# Patient Record
Sex: Male | Born: 1980 | Race: White | Hispanic: No | Marital: Single | Smoking: Never smoker
Health system: Southern US, Community
[De-identification: ages and names within clinical notes are randomized; demographics above are authoritative.]

## PROBLEM LIST (undated history)

## (undated) DIAGNOSIS — M549 Dorsalgia, unspecified: Secondary | ICD-10-CM

---
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
## 2000-03-21 ENCOUNTER — Emergency Department (HOSPITAL_COMMUNITY): Admission: EM | Admit: 2000-03-21 | Discharge: 2000-03-21 | Payer: Self-pay | Admitting: Emergency Medicine

## 2006-04-27 ENCOUNTER — Emergency Department (HOSPITAL_COMMUNITY): Admission: EM | Admit: 2006-04-27 | Discharge: 2006-04-27 | Payer: Self-pay | Admitting: Emergency Medicine

## 2007-06-28 ENCOUNTER — Emergency Department (HOSPITAL_COMMUNITY): Admission: EM | Admit: 2007-06-28 | Discharge: 2007-06-29 | Payer: Self-pay | Admitting: Emergency Medicine

## 2007-07-01 ENCOUNTER — Inpatient Hospital Stay (HOSPITAL_COMMUNITY): Admission: EM | Admit: 2007-07-01 | Discharge: 2007-07-04 | Payer: Self-pay | Admitting: Emergency Medicine

## 2007-07-02 ENCOUNTER — Ambulatory Visit: Payer: Self-pay | Admitting: Dentistry

## 2007-07-11 ENCOUNTER — Encounter: Admission: EM | Admit: 2007-07-11 | Discharge: 2007-07-11 | Payer: Self-pay | Admitting: Dentistry

## 2010-11-01 NOTE — Consult Note (Signed)
NAMESHIHEEM, CORPORAN                 ACCOUNT NO.:  [id number]   MEDICAL RECORD NO.:  [id number]          PATIENT TYPE:  INP   LOCATION:  1535                         FACILITY:  [facility]   PHYSICIAN:  Charlynne Pander, D.D.S.DATE OF BIRTH:  02-12-1981   DATE OF CONSULTATION:  07/02/2007  DATE OF DISCHARGE:                                 CONSULTATION   Terek L. Drew is a 30 year old male who is hospitalized with a history  of left facial swelling.  The patient was placed on IV antibiotic  therapy and dental consultation requested to rule out dental infection  and provide treatment as indicated.   MEDICAL HISTORY:  1. Left facial swelling/cellulitis with current IV antibiotic therapy      with Unasyn.  2. History of seasonal allergies - no current medications.   ALLERGIES/ADVERSE DRUG REACTIONS:  NONE KNOWN.   MEDICATIONS:  1. Unasyn 3 grams IV every 6 hours.  2. Tylenol 650 mg every 4 hours as needed.   SOCIAL HISTORY:  The patient is not married.  The patient is a  nonsmoker, nondrinker and denies any use of IV drugs.   FAMILY HISTORY:  Positive for hypertension.   FUNCTIONAL ASSESSMENT:  The patient was independent for ADLs prior to  this admission.   REVIEW OF SYSTEMS:  This is reviewed from the chart and health history  assessment form for this admission.   DENTAL HISTORY:  Chief complaint - dental consultation requested to  evaluate left facial swelling/cellulitis.   HISTORY OF PRESENT ILLNESS:  Patient with a history of left facial  swelling, which started approximately 3-4 days ago.  The patient went to  the ED and received a prescription for penicillin, as well as pain  medication.  The patient indicates that the swelling subsequently  increased and the patient went to the emergency room again on Monday and  was admitted and placed on IV antibiotic therapy.  Dental consultation  was then requested to evaluate to rule-out dental etiology provide total  treatment as  indicated.   The patient last saw a dentist approximately 1 year ago for evaluation  of this same tooth.  At that time the dentist would not pull the tooth  and recommended that it be restored with a dental restoration.  The  patient did not follow up with that dental treatment plan.   DENTAL EXAMINATION:  GENERAL:  The patient is well-developed, well-  nourished male in no acute distress.  VITAL SIGNS:  Blood pressure is 122/76, pulse rate 95, respirations are  18, temperature is 99.2.  HEAD/NECK:  There is significant left submandibular lymphadenopathy.  The patient without right submandibular lymphadenopathy. The patient  with trismus symptoms and there is a decreased maximum interincisal  opening approximately 15-20 mm at this time.  Access to the oral cavity  to extract this tooth would be very difficult at this time.  INTRAORAL EXAM:  Patient with a definite buccal swelling in the area of  #19.  This is fluctuant and tender to palpation/percussion.  DENTITION: the patient currently has an intact dentition with no missing  teeth.  PERIODONTAL: Patient with chronic periodontitis with plaque and calculus  accumulations, and incipient bone loss.  No significant tooth mobility  is noted at this time.  DENTAL CARIES: the patient has multiple dental caries and will need a  full series of dental radiographs to identify the extent of the dental  caries.  The caries associated with tooth #19 is extensive and is into  the pulp.  ENDODONTIC: The patient with a history of acute pulpitis symptoms  previously associated with tooth #19. Patient has not had significant  episodes of pain with this tooth at this time.  The primary complaining  is coming from the increased left facial swelling.  The patient does  have some symptoms of ear ache, which is most likely associated to this  dental etiology.  CROWN AND BRIDGE: There are no crown or bridge restorations.  OCCLUSION: Patient with a stable  occlusion at this time.   RADIOGRAPHIC INTERPRETATION:  A panoramic x-ray was taken on July 02, 2007.  There are no missing teeth.  There are multiple areas of dental  caries.  There is a periapical radiolucency associated with the apex of  tooth #19.  Some of the dental caries appear to be very close to  impinging on the pulp and may lead to other problems with tooth pain in  the near future. Suggest Full SEries of Dental Radiogrpahs to identify  the extent of dental diasease in the near future with the Dentist of his  choice.   ASSESSMENT:  1. Left facial swelling/cellulitis secondary to dental the etiology.  2. History of acute pulpitis symptoms.  3. 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  4. Multiple dental caries with a need for a full series of dental      radiographs to identify the extent of the dental caries.  5. History of oral neglect.  6. Stable occlusion.   PLAN/RECOMMENDATION:  1. I discussed the risks, benefits, and complications of various      treatment options with the patient in relationship to his medical      and dental conditions.  We discussed various treatment options to      include no treatment, extraction of tooth #19 with alveoloplasty as      indicated, incision and drainage as indicated, periodontal therapy,      dental restorations, referral to an oral surgeon for extraction of      remaining wisdom teeth, and need for overall comprehensive dental      treatment needs.  Due to the significant trismus at this time, it      was determined that we will allow time for the IV antibiotic      therapy to work and decrease the symptoms of trismus and increase      the maximum interincisal opening.  At that time I will determine      the ability to proceed with dental extraction tooth #19, which the      patient wishes to proceed with.  The patient indicates that he will      then followup with a dentist of his choice for other dental  care as      indicated.  This includes exam x-rays and overall dental treatment      need planning.  2. Discussion of findings with medical team as indicated.  Currently      the patient is on Unasyn  IV antibiotic      therapy. I personally feel  clindamycin 600-900 mg IV every 6 hours      may be a more appropriate antibiotic therapy, but I will defer to      the medical team at this time.  Will plan on reassessing the      patient on Thursday morning due to my absence from the clinic      tomorrow July 03, 2007.      Charlynne Pander, D.D.S.  Electronically Signed     RFK/MEDQ  D:  07/02/2007  T:  07/03/2007  Job:  [id number]   cc:   Altha Harm, MD   Charlynne Pander, D.D.S.  Fax: [phone]  Email: [email or web address]

## 2010-11-01 NOTE — H&P (Signed)
[name] Brady, [name] Brady                 ACCOUNT NO.:  [id number]   MEDICAL RECORD NO.:  [id number]          PATIENT TYPE:  INP   LOCATION:  1535                         FACILITY:  [facility]   PHYSICIAN:  Wilson Singer, M.D.DATE OF BIRTH:  [birth year]   DATE OF ADMISSION:  07/01/2007  DATE OF DISCHARGE:                              HISTORY & PHYSICAL   HISTORY OF PRESENT ILLNESS:  This is a 30 year old man who was in the  emergency room approximately 2 days ago with a toothache.  Apparently,  the toothache had improved to some extent but this morning he started to  notice swelling on the left side of his face.  He also has been feeling  feverish subjectively but no documented fever at home.  He has no nausea  or vomiting.  There are no rigors.   PAST SURGICAL HISTORY:  None.   PAST MEDICAL HISTORY:  No other serious illnesses.   MEDICATIONS:  No regular medications but recently has been on  hydrocodone/acetaminophen and also penicillin V as well as Ultram.   ALLERGIES:  None.   FAMILY HISTORY:  Hypertension.   SOCIAL HISTORY:  He is single and lives alone. He does not smoke and  does not drink alcohol.  He works intermittently as a Curator.   REVIEW OF SYSTEMS:  Apart from the symptoms mentioned above there are no  other symptoms referable to all systems reviewed.   PHYSICAL EXAMINATION:  VITAL SIGNS:  Temperature 100.7, blood pressure  143/90, pulse 80 on examination but previously recorded as 120 and 137  per minute, respiratory rate 12-14, saturation 98% on room air.  CARDIOVASCULAR:  Heart sounds are present and normal without any  murmurs.  RESPIRATORY:  Lung fields are clear.  ABDOMEN: Soft, nontender with no hepatosplenomegaly.  NEUROLOGICAL:  Alert and oriented with no focal neurological signs.  The left face is clearly swollen and inflamed with some localized  tenderness.  There is left neck lymphadenopathy.   INVESTIGATIONS:  Hemoglobin 16.8, white blood cell count  20.7, platelet  count 329,000.  Sodium 137, potassium 3.7, bicarbonate 25, glucose 115,  BUN 9, creatinine 0.83.  CT scan of the maxillofacial area shows dental  caries in the first molar area.   IMPRESSION:  1. Left facial cellulitis  2. Left first molar dental caries.   PLAN:  1. Admit.  2. Intravenous antibiotics.  3. Dental consultation in the morning.   Further recommendations will depend on patient's hospital progress.      Wilson Singer, M.D.  Electronically Signed     NCG/MEDQ  D:  07/01/2007  T:  07/02/2007  Job:  [id number]

## 2010-11-04 NOTE — Discharge Summary (Signed)
NAMEMARLEE, [name]                 ACCOUNT NO.:  [id number]   MEDICAL RECORD NO.:  [id number]          PATIENT TYPE:  INP   LOCATION:  1535                         FACILITY:  [facility]   PHYSICIAN:  Hind I Elsaid, MD      DATE OF BIRTH:  01/22/1981   DATE OF ADMISSION:  07/01/2007  DATE OF DISCHARGE:  07/04/2007                               DISCHARGE SUMMARY   DISCHARGE DIAGNOSES:  1. Left facial cellulitis.  2. 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  3. Multiple dental carries.   DISCHARGE MEDICATIONS:  1. Augment 875 mg p.o. b.i.d. for 2 weeks.  2. Percocet 5/325 1 to 2 tabs p.o. q.4 to 6 hours p.r.n.   CONSULTATIONS:  Dental consultation was done.   PROCEDURE:  CT of maxillofacial with contrast, diffuse soft tissue  swelling along the left mandible without evidence of focal soft tissue  abscess. Large caries involving the left mandibular first molar with  association  periapical radiolucency   HISTORY OF PRESENT ILLNESS:  Please review the history done by Dr.  Karilyn Cota. In summary, a 30 year old male admitted to the hospital with 2  days tooth ache, developed fever and swelling of his left face. The  patient was admitted and started on IV antibiotics. Dental consultation  was done. Will recommend to continue IV antibiotic with hopeful  resolution of the treatment and swelling to allow dental extraction. The  patient continued on antibiotics. Discussed with dentist.   PLAN:  To arrange dental extraction and caries cleaning as outpatient by  Dr. Oliver Hum on July 11, 2007, after completion of p.o. antibiotic.  We felt patient is medically stable to be discharged with home p.o.  antibiotic and analgesics.      Hind Bosie Helper, MD  Electronically Signed     HIE/MEDQ  D:  08/25/2007  T:  08/25/2007  Job:  [id number]

## 2011-03-09 LAB — BASIC METABOLIC PANEL
CO2: 25
Chloride: 102
GFR calc Af Amer: >60
Glucose, Bld: 115 — ABNORMAL HIGH
Potassium: 3.7
Sodium: 137

## 2011-03-09 LAB — DIFFERENTIAL
Basophils Absolute: 0
Basophils Relative: 0
Eosinophils Absolute: 0
Eosinophils Relative: 0
Eosinophils Relative: 1
Lymphocytes Relative: 19
Lymphocytes Relative: 9 — ABNORMAL LOW
Lymphs Abs: 1.9
Lymphs Abs: 2.4
Monocytes Absolute: 1.3 — ABNORMAL HIGH
Monocytes Absolute: 1.9 — ABNORMAL HIGH
Monocytes Relative: 9
Neutro Abs: 16.9 — ABNORMAL HIGH
Neutrophils Relative %: 82 — ABNORMAL HIGH

## 2011-03-09 LAB — CULTURE, BLOOD (ROUTINE X 2)
Culture: NO GROWTH
Culture: NO GROWTH

## 2011-03-09 LAB — COMPREHENSIVE METABOLIC PANEL
AST: 28
CO2: 26
Calcium: 9.1
Creatinine, Ser: 0.66
GFR calc Af Amer: >60
GFR calc non Af Amer: >60
Total Protein: 7.2

## 2011-03-09 LAB — CBC
HCT: 39.5
HCT: 50.3
HCT: 38.3 — ABNORMAL LOW
Hemoglobin: 13.8
Hemoglobin: 16.8
MCHC: 33.4
MCHC: 35.2
MCV: 88.9
MCV: 89.5
MCV: 89.5
MCV: 89.9
Platelets: 277
Platelets: 329
Platelets: 280
RBC: 4.69
RBC: 5.63
RDW: 11.9
RDW: 12.3
RDW: 12.5
RDW: 12
WBC: 12.6 — ABNORMAL HIGH
WBC: 20.7 — ABNORMAL HIGH

## 2011-03-09 LAB — BASIC METABOLIC PANEL WITH GFR
BUN: 9
Calcium: 9.8
Creatinine, Ser: 0.83
GFR calc non Af Amer: >60

## 2013-11-21 ENCOUNTER — Emergency Department (HOSPITAL_COMMUNITY): Payer: Medicaid Other

## 2013-11-21 ENCOUNTER — Encounter (HOSPITAL_COMMUNITY): Payer: Self-pay | Admitting: Emergency Medicine

## 2013-11-21 ENCOUNTER — Emergency Department (HOSPITAL_COMMUNITY)
Admission: EM | Admit: 2013-11-21 | Discharge: 2013-11-21 | Disposition: A | Discharge status: Home / Self Care | Payer: Medicaid Other | Attending: Emergency Medicine | Admitting: Emergency Medicine

## 2013-11-21 DIAGNOSIS — M545 Low back pain, unspecified: Secondary | ICD-10-CM | POA: Insufficient documentation

## 2013-11-21 DIAGNOSIS — G8929 Other chronic pain: Secondary | ICD-10-CM | POA: Insufficient documentation

## 2013-11-21 HISTORY — DX: Dorsalgia, unspecified: M54.9

## 2013-11-21 MED ORDER — ONDANSETRON 4 MG PO TBDP
4.0000 mg | ORAL_TABLET | Freq: Once | ORAL | Status: DC
  Filled 2013-11-21: qty 1

## 2013-11-21 MED ORDER — DEXAMETHASONE SODIUM PHOSPHATE 10 MG/ML IJ SOLN
10.0000 mg | Freq: Once | INTRAMUSCULAR | Status: AC
  Administered 2013-11-21: 10 mg via INTRAMUSCULAR
  Filled 2013-11-21 (×2): qty 1

## 2013-11-21 MED ORDER — CYCLOBENZAPRINE HCL 10 MG PO TABS: 10.0000 mg | ORAL_TABLET | Freq: Two times a day (BID) | ORAL | Status: AC | PRN

## 2013-11-21 MED ORDER — HYDROMORPHONE HCL PF 1 MG/ML IJ SOLN
1.0000 mg | Freq: Once | INTRAMUSCULAR | Status: DC
  Filled 2013-11-21: qty 1

## 2013-11-21 MED ORDER — TRAMADOL HCL 50 MG PO TABS: 50.0000 mg | ORAL_TABLET | Freq: Four times a day (QID) | ORAL | Status: AC | PRN

## 2013-11-21 NOTE — ED Notes (Signed)
Pt ambulates to xray.

## 2013-11-21 NOTE — Discharge Instructions (Signed)
Back Pain, Adult Low back pain is very common. About 1 in 5 people have back pain.The cause of low back pain is rarely dangerous. The pain often gets better over time.About half of people with a sudden onset of back pain feel better in just 2 weeks. About 8 in 10 people feel better by 6 weeks.  CAUSES Some common causes of back pain include:  Strain of the muscles or ligaments supporting the spine.  Wear and tear (degeneration) of the spinal discs.  Arthritis.  Direct injury to the back. DIAGNOSIS Most of the time, the direct cause of low back pain is not known.However, back pain can be treated effectively even when the exact cause of the pain is unknown.Answering your caregiver's questions about your overall health and symptoms is one of the most accurate ways to make sure the cause of your pain is not dangerous. If your caregiver needs more information, he or she may order lab work or imaging tests (X-rays or MRIs).However, even if imaging tests show changes in your back, this usually does not require surgery. HOME CARE INSTRUCTIONS For many people, back pain returns.Since low back pain is rarely dangerous, it is often a condition that people can learn to manageon their own.   Remain active. It is stressful on the back to sit or stand in one place. Do not sit, drive, or stand in one place for more than 30 minutes at a time. Take short walks on level surfaces as soon as pain allows.Try to increase the length of time you walk each day.  Do not stay in bed.Resting more than 1 or 2 days can delay your recovery.  Do not avoid exercise or work.Your body is made to move.It is not dangerous to be active, even though your back may hurt.Your back will likely heal faster if you return to being active before your pain is gone.  Pay attention to your body when you bend and lift. Many people have less discomfortwhen lifting if they bend their knees, keep the load close to their bodies,and  avoid twisting. Often, the most comfortable positions are those that put less stress on your recovering back.  Find a comfortable position to sleep. Use a firm mattress and lie on your side with your knees slightly bent. If you lie on your back, put a pillow under your knees.  Only take over-the-counter or prescription medicines as directed by your caregiver. Over-the-counter medicines to reduce pain and inflammation are often the most helpful.Your caregiver may prescribe muscle relaxant drugs.These medicines help dull your pain so you can more quickly return to your normal activities and healthy exercise.  Put ice on the injured area.  Put ice in a plastic bag.  Place a towel between your skin and the bag.  Leave the ice on for 15-20 minutes, 03-04 times a day for the first 2 to 3 days. After that, ice and heat may be alternated to reduce pain and spasms.  Ask your caregiver about trying back exercises and gentle massage. This may be of some benefit.  Avoid feeling anxious or stressed.Stress increases muscle tension and can worsen back pain.It is important to recognize when you are anxious or stressed and learn ways to manage it.Exercise is a great option. SEEK MEDICAL CARE IF:  You have pain that is not relieved with rest or medicine.  You have pain that does not improve in 1 week.  You have new symptoms.  You are generally not feeling well. SEEK   IMMEDIATE MEDICAL CARE IF:   You have pain that radiates from your back into your legs.  You develop new bowel or bladder control problems.  You have unusual weakness or numbness in your arms or legs.  You develop nausea or vomiting.  You develop abdominal pain.  You feel faint. Document Released: 06/05/2005 Document Revised: 12/05/2011 Document Reviewed: 10/24/2010 [employer] Patient Information 2014 [employer].  

## 2013-11-21 NOTE — ED Notes (Addendum)
Pt c/o lower back pain x 2 weeks.  Pain score 5/10.  Pt sts Hx of chronic back pain and injuries.  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

## 2013-11-21 NOTE — ED Notes (Signed)
Pt declines pharmacologic interventions at this time, provided gown with instructions

## 2013-11-21 NOTE — ED Notes (Signed)
Pt alert, nad, resp even unlabored, skin pwd, denies needs, ambulates to discharge 

## 2013-11-21 NOTE — ED Provider Notes (Signed)
CSN: [id number]     Arrival date & time 11/21/13  1409 History  This chart was scribed for non-physician practitioner, Marlon Peliffany Cotton Beckley, PA-C,working with Audree CamelScott T Goldston, MD, by Karle PlumberJennifer Tensley, ED Scribe.  This patient was seen in room WTR8/WTR8 and the patient's care was started at 2:48 PM.  Chief Complaint  Patient presents with  . Back Pain   The history is provided by the patient. No language interpreter was used.   HPI Comments:  [name] Brady is a 33 y.o. male with chronic back pain who presents to the Emergency Department complaining of worsening moderate lower back pain that radiates to his groin that started approximately 2 weeks ago. He states he was pushing a toilet PTA and experienced a sharp shooting pain that caused him to fall to the ground. He reports lifting heavy things on a daily basis due to his job. He states he feels tingling in his toes while sitting. He denies taking anything for pain. He reports the pain is somewhat relieved by standing. He denies numbness. He declines any pain medication here or at home. Says he does not like shots of pills. He is here since wife and parents want him to get an xray of his back. He is ambulatory but with pain  Past Medical History  Diagnosis Date  . Back pain    History reviewed. No pertinent past surgical history. History reviewed. No pertinent family history. History  Substance Use Topics  . Smoking status: Never Smoker   . Smokeless tobacco: Not on file  . Alcohol Use: No    Review of Systems  Musculoskeletal: Positive for back pain.  Neurological: Negative for numbness.  All other systems reviewed and are negative.   Allergies  Review of patient's allergies indicates not on file.  Home Medications   Prior to Admission medications   Medication Sig Start Date End Date Taking? Authorizing Provider  cyclobenzaprine (FLEXERIL) 10 MG tablet Take 1 tablet (10 mg total) by mouth 2 (two) times daily as needed for muscle  spasms. 11/21/13   Brookes Craine Irine SealG Noemie Devivo, PA-C  traMADol (ULTRAM) 50 MG tablet Take 1 tablet (50 mg total) by mouth every 6 (six) hours as needed. 11/21/13   Dorthula Matasiffany G Dyana Magner, PA-C   Triage Vitals: BP 136/91  Pulse 73  Temp(Src) 98.3 F (36.8 C) (Oral)  Resp 16  SpO2 98% Physical Exam  Nursing note and vitals reviewed. Constitutional: He is oriented to person, place, and time. He appears well-developed and well-nourished.  HENT:  Head: Normocephalic and atraumatic.  Eyes: EOM are normal.  Neck: Normal range of motion.  Cardiovascular: Normal rate.   Pulmonary/Chest: Effort normal.  Musculoskeletal:  Limited ROM of the spine due to pain. Full ROM of knees and hips. Positive SLR on right side.  Neurological: He is alert and oriented to person, place, and time.  Skin: Skin is warm and dry.  Psychiatric: He has a normal mood and affect. His behavior is normal.    ED Course  Procedures (including critical care time) DIAGNOSTIC STUDIES: Oxygen Saturation is 98% on RA, normal by my interpretation.   COORDINATION OF CARE: 2:53 PM- Will X-Ray L-Spine and order pain medication. Pt verbalizes understanding and agrees to plan.  Medications  dexamethasone (DECADRON) injection 10 mg (10 mg Intramuscular Given 11/21/13 1615)    Labs Review Labs Reviewed - No data to display  Imaging Review Dg Lumbar Spine Complete  11/21/2013   CLINICAL DATA:  Back pain  EXAM: LUMBAR  SPINE - COMPLETE 4+ VIEW  COMPARISON:  None.  FINDINGS: There is minimal convex leftward lumbar scoliosis. No fracture. No subluxation. Intervertebral disc spaces are preserved throughout. No evidence for pars interarticularis defect. The SI joints are normal bilaterally.  IMPRESSION: No acute bony findings to explain the patient's history of pain.   Electronically Signed   By: Kennith Center M.D.   On: 11/21/2013 15:44     EKG Interpretation None      MDM   Final diagnoses:  Low back pain    Patient declined shot of IM  analgesic but did eventually agree to IM Decadron. Declines pain or muscle relaxer Rx, but I gave him one anyway and he is free to not get them filled if he chooses not to. I told him he could benefit from an orthopedist, possibly getting an MRI or physical therapy- he seemed receptive to this.  32 y.o.[name] Brady's  with back pain. No neurological deficits and normal neuro exam. Patient can walk but states is painful. No loss of bowel or bladder control. No concern for cauda equina. No fever, night sweats, weight loss, h/o cancer, IVDU. RICE protocol and pain medicine indicated and discussed with patient.   Patient Plan 1. Medications: pain medication, muscle relaxer and usual home medications  2. Treatment: rest, drink plenty of fluids, gentle stretching as discussed, alternate ice and heat  3. Follow Up: Please followup with your primary doctor for discussion of your diagnoses and further evaluation after today's visit; if you do not have a primary care doctor use the resource guide provided to find one   Vital signs are stable at discharge. Filed Vitals:   11/21/13 1419  BP: 136/91  Pulse: 73  Temp: 98.3 F (36.8 C)  Resp: 16    Patient/guardian has voiced understanding and agreed to follow-up with the PCP or specialist.    I personally performed the services described in this documentation, which was scribed in my presence. The recorded information has been reviewed and is accurate.    Dorthula Matas, PA-C 11/21/13 1926

## 2013-11-25 NOTE — ED Provider Notes (Signed)
Medical screening examination/treatment/procedure(s) were performed by non-physician practitioner and as supervising physician I was immediately available for consultation/collaboration.   EKG Interpretation None        Audree Camel, MD 11/25/13 0040

## 2014-10-03 IMAGING — CR DG LUMBAR SPINE COMPLETE 4+V
5 series · 5 of 5 positions shown · non-contrast
Comparison: None.

CLINICAL DATA: Back pain

EXAM:
LUMBAR SPINE - COMPLETE 4+ VIEW

[t lumbar spine ap]
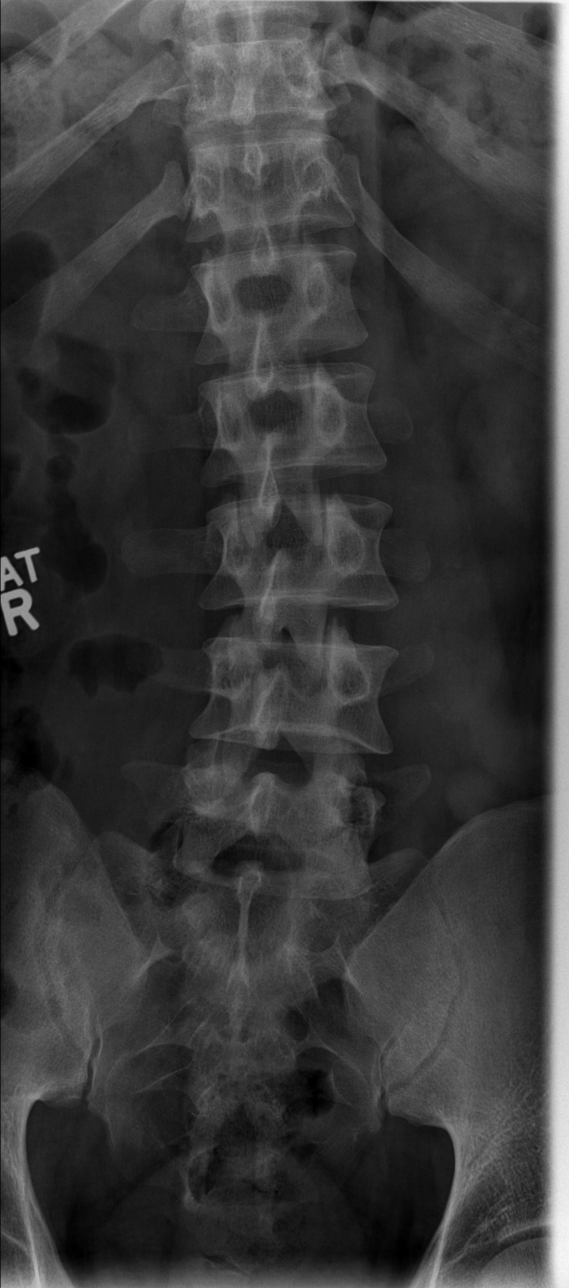

[t lumbar spine obl (1 of 2)]
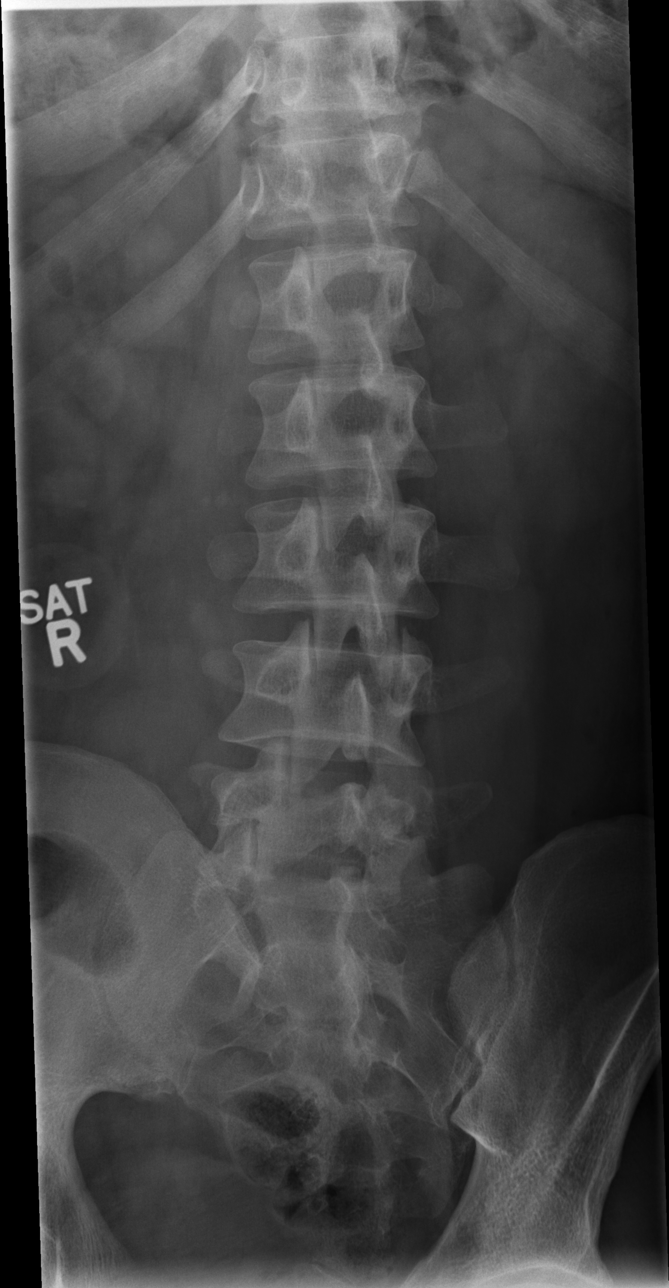

[t lumbar spine obl (2 of 2)]
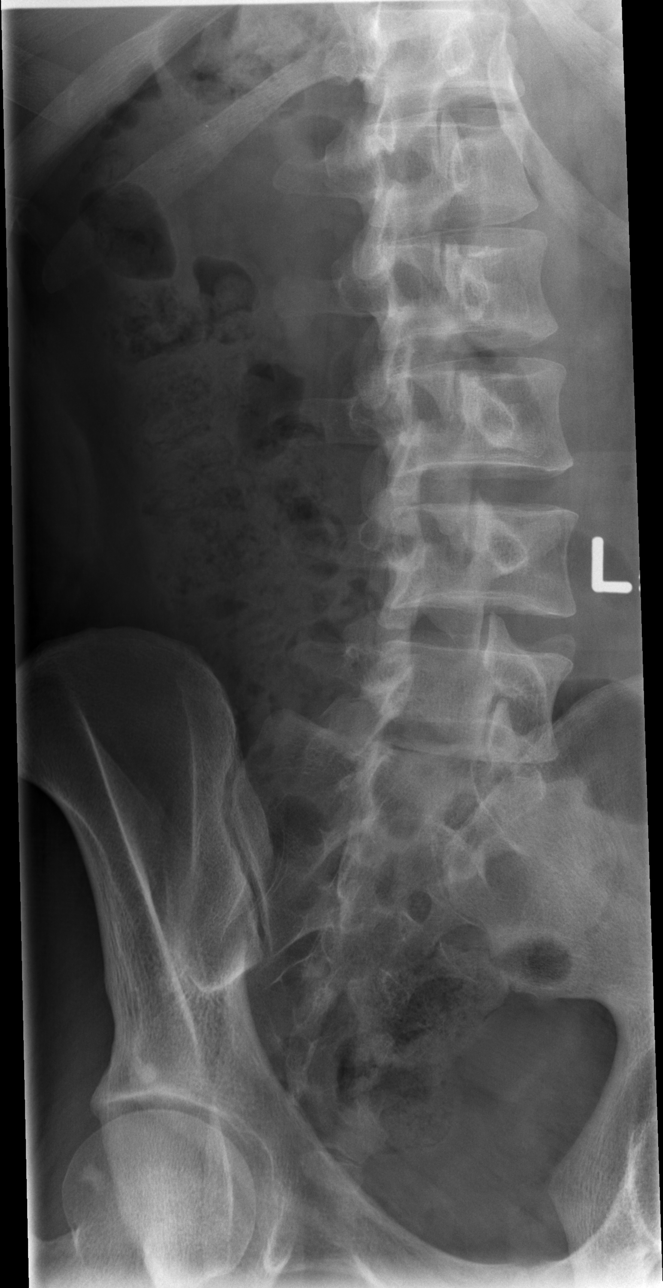

[t lumbar spine lat]
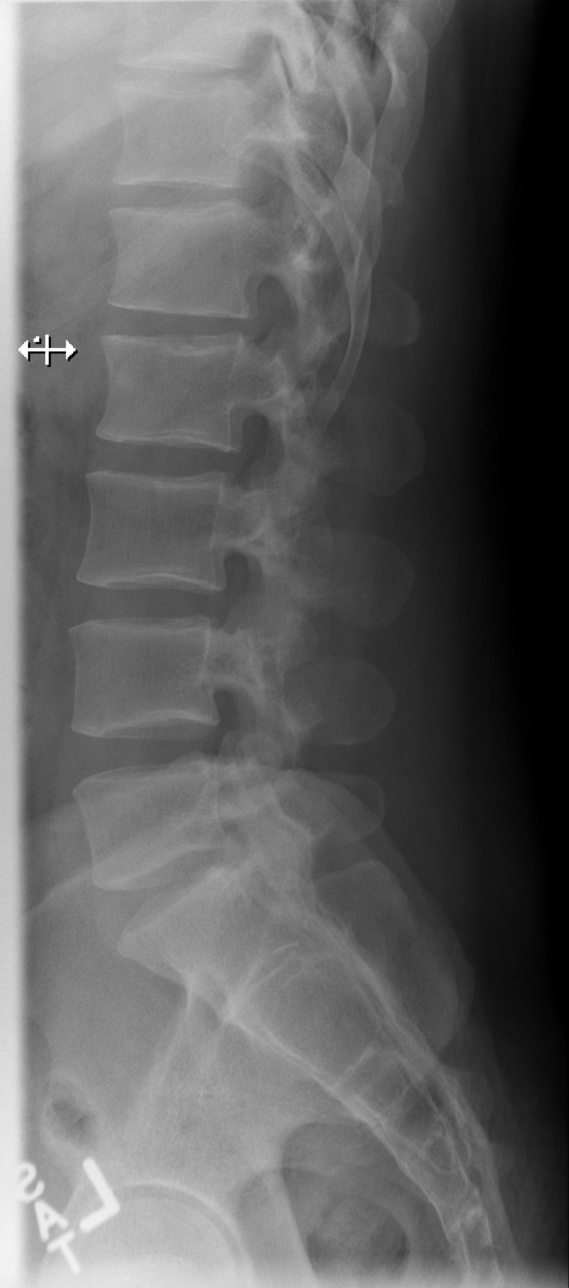

[t lumbar l-5 s-1 spot]
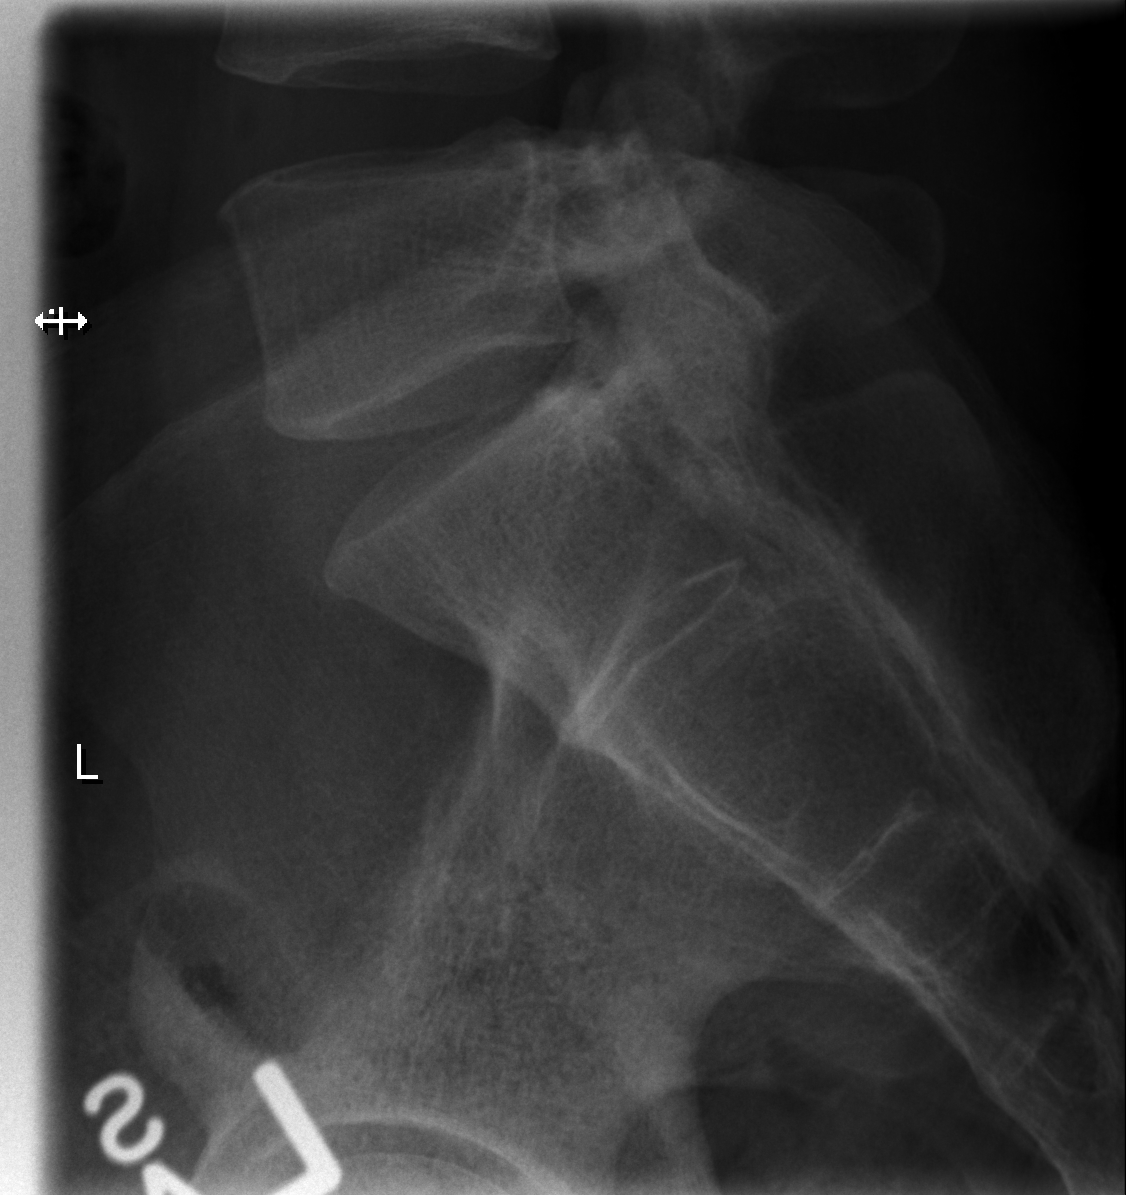

[5 of 5 positions shown; findings below may reference images not displayed]

FINDINGS: There is minimal convex leftward lumbar scoliosis. No fracture. No
subluxation. Intervertebral disc spaces are preserved throughout. No
evidence for pars interarticularis defect. The SI joints are normal
bilaterally.
IMPRESSION: No acute bony findings to explain the patient's history of pain.
